# Patient Record
Sex: Female | Born: 2000 | Race: White | Hispanic: No | Marital: Single | Smoking: Former smoker
Health system: Southern US, Community
[De-identification: ages and names within clinical notes are randomized; demographics above are authoritative.]

## PROBLEM LIST (undated history)

## (undated) DIAGNOSIS — I10 Essential (primary) hypertension: Secondary | ICD-10-CM

## (undated) DIAGNOSIS — R109 Unspecified abdominal pain: Secondary | ICD-10-CM

## (undated) DIAGNOSIS — N200 Calculus of kidney: Secondary | ICD-10-CM

## (undated) DIAGNOSIS — Z9109 Other allergy status, other than to drugs and biological substances: Secondary | ICD-10-CM

## (undated) DIAGNOSIS — G8929 Other chronic pain: Secondary | ICD-10-CM

## (undated) DIAGNOSIS — F909 Attention-deficit hyperactivity disorder, unspecified type: Secondary | ICD-10-CM

## (undated) DIAGNOSIS — K5792 Diverticulitis of intestine, part unspecified, without perforation or abscess without bleeding: Secondary | ICD-10-CM

## (undated) DIAGNOSIS — F411 Generalized anxiety disorder: Secondary | ICD-10-CM

## (undated) DIAGNOSIS — M542 Cervicalgia: Secondary | ICD-10-CM

## (undated) DIAGNOSIS — E559 Vitamin D deficiency, unspecified: Secondary | ICD-10-CM

## (undated) HISTORY — DX: Essential (primary) hypertension: I10

## (undated) HISTORY — DX: Cervicalgia: M54.2

## (undated) HISTORY — DX: Vitamin D deficiency, unspecified: E55.9

## (undated) HISTORY — DX: Other allergy status, other than to drugs and biological substances: Z91.09

## (undated) HISTORY — PX: APPENDECTOMY: SHX54

## (undated) HISTORY — DX: Attention-deficit hyperactivity disorder, unspecified type: F90.9

## (undated) HISTORY — DX: Other chronic pain: G89.29

---
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
## 2018-01-12 ENCOUNTER — Ambulatory Visit (INDEPENDENT_AMBULATORY_CARE_PROVIDER_SITE_OTHER): Payer: Medicaid Other | Admitting: Neurology

## 2018-01-12 ENCOUNTER — Encounter: Payer: Self-pay | Admitting: Neurology

## 2018-01-12 VITALS — BP 112/71 | HR 63 | Ht 60.0 in | Wt 112.0 lb

## 2018-01-12 DIAGNOSIS — M542 Cervicalgia: Secondary | ICD-10-CM | POA: Diagnosis not present

## 2018-01-12 DIAGNOSIS — M7918 Myalgia, other site: Secondary | ICD-10-CM | POA: Diagnosis not present

## 2018-01-12 DIAGNOSIS — M5481 Occipital neuralgia: Secondary | ICD-10-CM

## 2018-01-12 DIAGNOSIS — S39012A Strain of muscle, fascia and tendon of lower back, initial encounter: Secondary | ICD-10-CM | POA: Insufficient documentation

## 2018-01-12 MED ORDER — TIZANIDINE HCL 4 MG PO TABS: ORAL_TABLET | ORAL | 3 refills | Status: DC

## 2018-01-12 NOTE — Patient Instructions (Signed)
- Physical therapy for neck and low back pain: Chronic Cervical and Acute Lumbar myofascial pain Please evaluate and treat including stretching, strengthening, manual therapy/massage, heating, TENS unit,  strengthening as clinically warranted as well as any other modality as recommended by evaluation. - Orthopaedics for evaluation of Occipital nerve intervention such as radiofrequency ablation, c3/c3 medial branch block or any other procedure as clinically warranted by examination of Dr. Alvester Morin. Can also evaluate for ESI in the lumbar spine if needed. Chronic neck and low back pain, likely musculoskeletal and myofascial - Tizanidine at bedtime for her cervical myofascial pain and acute lumbar myofascial strain - Pain on palpation of cervical muscles and lower lumbar paraspinals. Likely muscle spasms. PT. - Discussed medication overuse, and sequelae of using BC powders daily including organ damage and rebound headaches.  Tizanidine tablets or capsules What is this medicine? TIZANIDINE (tye ZAN i deen) helps to relieve muscle spasms. It may be used to help in the treatment of multiple sclerosis and spinal cord injury. This medicine may be used for other purposes; ask your health care provider or pharmacist if you have questions. COMMON BRAND NAME(S): Zanaflex What should I tell my health care provider before I take this medicine? They need to know if you have any of these conditions: -kidney disease -liver disease -low blood pressure -mental disorder -an unusual or allergic reaction to tizanidine, other medicines, lactose (tablets only), foods, dyes, or preservatives -pregnant or trying to get pregnant -breast-feeding How should I use this medicine? Take this medicine by mouth with a full glass of water. Take this medicine on an empty stomach, at least 30 minutes before or 2 hours after food. Do not take with food unless you talk with your doctor. Follow the directions on the prescription label.  Take your medicine at regular intervals. Do not take your medicine more often than directed. Do not stop taking except on your doctor's advice. Suddenly stopping the medicine can be very dangerous. Talk to your pediatrician regarding the use of this medicine in children. Patients over 93 years old may have a stronger reaction and need a smaller dose. Overdosage: If you think you have taken too much of this medicine contact a poison control center or emergency room at once. NOTE: This medicine is only for you. Do not share this medicine with others. What if I miss a dose? If you miss a dose, take it as soon as you can. If it is almost time for your next dose, take only that dose. Do not take double or extra doses. What may interact with this medicine? Do not take this medicine with any of the following medications: -ciprofloxacin -cisapride -dofetilide -dronedarone -fluvoxamine -narcotic medicines for cough -pimozide -thiabendazole -thioridazine -ziprasidone This medicine may also interact with the following medications: -acyclovir -alcohol -antihistamines for allergy, cough and cold -baclofen -certain antibiotics like levofloxacin, ofloxacin -certain medicines for anxiety or sleep -certain medicines for blood pressure, heart disease, irregular heart beat -certain medicines for depression like amitriptyline, fluoxetine, sertraline -certain medicines for seizures like phenobarbital, primidone -certain medicines for stomach problems like cimetidine, famotidine -female hormones, like estrogens or progestins and birth control pills, patches, rings, or injections -general anesthetics like halothane, isoflurane, methoxyflurane, propofol -local anesthetics like lidocaine, pramoxine, tetracaine -medicines that relax muscles for surgery -narcotic medicines for pain -other medicines that prolong the QT interval (cause an abnormal heart rhythm) -phenothiazines like chlorpromazine,  mesoridazine, prochlorperazine -ticlopidine -zileuton This list may not describe all possible interactions. Give your health care provider  a list of all the medicines, herbs, non-prescription drugs, or dietary supplements you use. Also tell them if you smoke, drink alcohol, or use illegal drugs. Some items may interact with your medicine. What should I watch for while using this medicine? Tell your doctor or health care professional if your symptoms do not start to get better or if they get worse. You may get drowsy or dizzy. Do not drive, use machinery, or do anything that needs mental alertness until you know how this medicine affects you. Do not stand or sit up quickly, especially if you are an older patient. This reduces the risk of dizzy or fainting spells. Alcohol may interfere with the effect of this medicine. Avoid alcoholic drinks. If you are taking another medicine that also causes drowsiness, you may have more side effects. Give your health care provider a list of all medicines you use. Your doctor will tell you how much medicine to take. Do not take more medicine than directed. Call emergency for help if you have problems breathing or unusual sleepiness. Your mouth may get dry. Chewing sugarless gum or sucking hard candy, and drinking plenty of water may help. Contact your doctor if the problem does not go away or is severe. What side effects may I notice from receiving this medicine? Side effects that you should report to your doctor or health care professional as soon as possible: -allergic reactions like skin rash, itching or hives, swelling of the face, lips, or tongue -breathing problems -hallucinations -signs and symptoms of liver injury like dark yellow or brown urine; general ill feeling or flu-like symptoms; light-colored stools; loss of appetite; nausea; right upper quadrant belly pain; unusually weak or tired; yellowing of the eyes or skin -signs and symptoms of low blood  pressure like dizziness; feeling faint or lightheaded, falls; unusually weak or tired -unusually slow heartbeat -unusually weak or tired Side effects that usually do not require medical attention (report to your doctor or health care professional if they continue or are bothersome): -blurred vision -constipation -dizziness -dry mouth -tiredness This list may not describe all possible side effects. Call your doctor for medical advice about side effects. You may report side effects to FDA at 1-800-FDA-1088. Where should I keep my medicine? Keep out of the reach of children. Store at room temperature between 15 and 30 degrees C (59 and 86 degrees F). Throw away any unused medicine after the expiration date. NOTE: This sheet is a summary. It may not cover all possible information. If you have questions about this medicine, talk to your doctor, pharmacist, or health care provider.  2018 [employer]vier/Gold Standard (2014-12-12 13:52:12)

## 2018-01-12 NOTE — Progress Notes (Signed)
[facility]    Provider:  Dr Lucia Gaskins Referring Provider:  Norval Gable, DO Primary Care Physician:  Norval Gable, DO  CC:  Headaches  HPI:  [name] Escobar is a 17 y.o. female here as requested by Dr.  Norval Gable, DO for chronic non-intractable headache. PMHx Migraines, chronic neck pain, panic attacks. She says her migraines started worsening 4 months ago in the setting of chronic neck and back pain for years for patient. She has had migraines for several years. The neck pain exacerbates migraines. She has tightness and tense neck worse when she tries to sleep. She is extremely afraid of needles. The head pain is in the back radiating up the back, more pressure, throbbing, intense and tight, +nausea, no light sensitivity, no smell sensitivity, no noise sensitivity. Also pressure in the temples. She has headaches daily. Worse when she sleeps and lays down, morning headaches.   Reviewed notes, labs and imaging from outside physicians, which showed:  Reviewed extensive notes from referring physician  Norval Gable, DO.  Patient has trouble with anxiety, has decreased energy level and is sleeping poorly, partly due to migraine headaches also other mood disorders.  No supplemental vitamins and iron and poor nutrition.  Patient does not exercise.  She sleeps 9 hours per night.  Her libido is decreased.  She has anxiety, excessive worry, fatigue, muscle tension, nervousness and panic attacks.  Onset was gradual years ago.  Anxiety has been occurring in an intermittent pattern.  Most of her medical problems are due to anxiety she uses benzodiazepines as needed requires 1 pill a day minimum.  She also has chronic neck pain.  She has fatigue and thyroid has been checked, vitamin D deficiency which is being followed, she currently uses tobacco and has been counseled multiple times about smoking and sequelae of smoking.  Also chronic pain.  Urine pregnancy test  -11/14/2017 was negative, CBC unremarkable also August 2019, CMP was unremarkable with BUN 10 and creatinine 1 this was taken on October 21, 2017, A1c 5.2, HIV nonreactive, TSH 1.1 normal, vitamin D 20 abnormal.      Review of Systems: Patient complains of symptoms per HPI as well as the following symptoms: headache, numbness, weakness, aching muscles, eye pain. Pertinent negatives and positives per HPI. All others negative.   Social History   Socioeconomic History  . Marital status: Single    Spouse name: Not on file  . Number of children: Not on file  . Years of education: 44  . Highest education level: 11th grade  Occupational History  . Not on file  Social Needs  . Financial resource strain: Not on file  . Food insecurity:    Worry: Not on file    Inability: Not on file  . Transportation needs:    Medical: Not on file    Non-medical: Not on file  Tobacco Use  . Smoking status: Former Smoker    Packs/day: 1.50    Years: 4.00    Pack years: 6.00    Types: Cigarettes    Last attempt to quit: 10/12/2017    Years since quitting: 0.2  . Smokeless tobacco: Never Used  Substance and Sexual Activity  . Alcohol use: Yes    Comment: rarely, only drank 3 times   . Drug use: Yes    Comment: CBD 01/12/18; Marijuana, denies IDU or other substances  . Sexual activity: Yes    Partners: Male    Birth control/protection: Injection  Lifestyle  .  Physical activity:    Days per week: Not on file    Minutes per session: Not on file  . Stress: Not on file  Relationships  . Social connections:    Talks on phone: Not on file    Gets together: Not on file    Attends religious service: Not on file    Active member of club or organization: Not on file    Attends meetings of clubs or organizations: Not on file    Relationship status: Not on file  . Intimate partner violence:    Fear of current or ex partner: Not on file    Emotionally abused: Not on file    Physically abused: Not on  file    Forced sexual activity: Not on file  Other Topics Concern  . Not on file  Social History Narrative   Parents are separated so the patient stays separately with mother and father.  She has a 81 year old sister not in the home.  1 dog at home.   Right handed   Caffeine: never     Family History  Problem Relation Age of Onset  . Hypertension Mother   . Migraines Mother   . Depression Sister   . Colon cancer Paternal Grandfather     Past Medical History:  Diagnosis Date  . Abdominal pain    hospitalized for abd pain at age 39yrs, no diagnosis at this time  . ADHD   . Anxiety state 05/19/2014  . Chronic neck pain   . Diverticulitis   . Hypertension, benign   . Kidney stone   . Kidney stone    right side CT 05/16/15 showed 3mm right side kidney stone  . Pollen allergies   . Vitamin D deficiency     Past Surgical History:  Procedure Laterality Date  . CECAL DIVERTICULECTOMY  05/17/2014  . LAPAROSCOPIC APPENDECTOMY  05/17/2014  . LAPAROSCOPIC APPENDECTOMY N/A 05/17/2014   Procedure: APPENDECTOMY LAPAROSCOPIC;  Surgeon: Judie Petit. Leonia Corona, MD;  Location: [facility] OR;  Service: Pediatrics;  Laterality: N/A;  . LAPAROSCOPIC ILEOCECECTOMY N/A 05/17/2014   Procedure: LAPAROSCOPIC DIVERTICULECTOMY ;  Surgeon: Judie Petit. Leonia Corona, MD;  Location: [facility] OR;  Service: Pediatrics;  Laterality: N/A;    Current Outpatient Medications  Medication Sig Dispense Refill  . DIFFERIN 0.1 % gel Apply 1 application topically at bedtime.   1  . medroxyPROGESTERone (DEPO-PROVERA) 150 MG/ML injection Inject 150 mg into the muscle every 3 (three) months.    . metoprolol tartrate (LOPRESSOR) 50 MG tablet Take 50 mg by mouth daily.    Marland Kitchen tiZANidine (ZANAFLEX) 4 MG tablet Take 1-2 tabs at bedtime for muscle spasms. 60 tablet 3   No current facility-administered medications for this visit.     Allergies as of 01/12/2018 - Review Complete 01/12/2018  Allergen Reaction Noted  . Amoxicillin Rash 03/05/2012     Vitals: BP 112/71 (BP Location: Right Arm, Patient Position: Sitting)   Pulse 63   Ht 5' (1.524 m)   Wt 112 lb (50.8 kg)   LMP 04/17/2017 (Within Months)   BMI 21.87 kg/m  Last Weight:  Wt Readings from Last 1 Encounters:  01/12/18 112 lb (50.8 kg) (21 %, Z= -0.82)*   * Growth percentiles are based on CDC (Girls, 2-20 Years) data.   Last Height:   Ht Readings from Last 1 Encounters:  01/12/18 5' (1.524 m) (5 %, Z= -1.67)*   * Growth percentiles are based on CDC (Girls, 2-20 Years) data.  Physical exam: Exam: Gen: NAD, conversant, well nourised, well groomed                     CV: RRR, no MRG. No Carotid Bruits. No peripheral edema, warm, nontender Eyes: Conjunctivae clear without exudates or hemorrhage MSK: Pain on palpation of cervical muscles and lower lumbar paraspinals.  Neuro: Detailed Neurologic Exam  Speech:    Speech is normal; fluent and spontaneous with normal comprehension.  Cognition:    The patient is oriented to person, place, and time;     recent and remote memory intact;     language fluent;     normal attention, concentration,     fund of knowledge Cranial Nerves:    The pupils are equal, round, and reactive to light. The fundi are normal and spontaneous venous pulsations are present. Visual fields are full to finger confrontation. Extraocular movements are intact. Trigeminal sensation is intact and the muscles of mastication are normal. The face is symmetric. The palate elevates in the midline. Hearing intact. Voice is normal. Shoulder shrug is normal. The tongue has normal motion without fasciculations.   Coordination:    Normal finger to nose and heel to shin. Normal rapid alternating movements.   Gait:    Heel-toe and tandem gait are normal.   Motor Observation:    No asymmetry, no atrophy, and no involuntary movements noted. Tone:    Normal muscle tone.    Posture:    Posture is normal. normal erect    Strength:    Strength is  V/V in the upper and lower limbs.      Sensation: intact to LT     Reflex Exam:  DTR's:    Deep tendon reflexes in the upper and lower extremities are normal bilaterally.   Toes:    The toes are downgoing bilaterally.   Clonus:    Clonus is absent.      Assessment/Plan:  17 year old patient with occipital headaches that sound more like cervicalgia than migraines. Taking BCs daily.   - Physical therapy for neck and low back pain: Chronic Cervical and Acute Lumbar myofascial pain Please evaluate and treat including stretching, strengthening, manual therapy/massage, heating, TENS unit,  strengthening as clinically warranted as well as any other modality as recommended by evaluation. - Orthopaedics for evaluation of Occipital nerve intervention such as radiofrequency ablation, c3/c3 medial branch block or any other procedure as clinically warranted by examination of Dr. Alvester Morin. Can also evaluate for ESI in the lumbar spine if needed. Chronic neck and low back pain, likely musculoskeletal and myofascial - Tizanidine at bedtime for her cervical myofascial pain and acute lumbar myofascial strain - Pain on palpation of cervical muscles and lower lumbar paraspinals. Likely muscle spasms. PT. - Discussed medication overuse, and sequelae of using BC powders daily including organ damage and rebound headaches.  Orders Placed This Encounter  Procedures  . Ambulatory referral to Physical Therapy  . Ambulatory referral to Orthopedic Surgery   Meds ordered this encounter  Medications  . tiZANidine (ZANAFLEX) 4 MG tablet    Sig: Take 1-2 tabs at bedtime for muscle spasms.    Dispense:  60 tablet    Refill:  3    Discussed: To prevent or relieve headaches, try the following: Cool Compress. Lie down and place a cool compress on your head.  Avoid headache triggers. If certain foods or odors seem to have triggered your migraines in the past, avoid them. A headache diary  might help you identify  triggers.  Include physical activity in your daily routine. Try a daily walk or other moderate aerobic exercise.  Manage stress. Find healthy ways to cope with the stressors, such as delegating tasks on your to-do list.  Practice relaxation techniques. Try deep breathing, yoga, massage and visualization.  Eat regularly. Eating regularly scheduled meals and maintaining a healthy diet might help prevent headaches. Also, drink plenty of fluids.  Follow a regular sleep schedule. Sleep deprivation might contribute to headaches Consider biofeedback. With this mind-body technique, you learn to control certain bodily functions - such as muscle tension, heart rate and blood pressure - to prevent headaches or reduce headache pain.    Proceed to emergency room if you experience new or worsening symptoms or symptoms do not resolve, if you have new neurologic symptoms or if headache is severe, or for any concerning symptom.   Provided education and documentation from American headache Society toolbox including articles on: chronic migraine medication overuse headache, chronic migraines, prevention of migraines, behavioral and other nonpharmacologic treatments for headache.   A total of 60 minutes was spent face-to-face with this patient. Over half this time was spent on counseling patient on the  1. Cervical myofascial pain syndrome   2. Cervicalgia   3. Acute myofascial strain of lumbar region, initial encounter   4. Bilateral occipital neuralgia    diagnosis and different diagnostic and therapeutic options, counseling and coordination of care, risks ans benefits of management, compliance, or risk factor reduction and education.    Cc: Norval Gable, DO   [name] Dean, MD  [facility] [address] [place], [place] [address]  Phone [phone] Fax [phone]

## 2018-02-02 ENCOUNTER — Ambulatory Visit (INDEPENDENT_AMBULATORY_CARE_PROVIDER_SITE_OTHER): Payer: Self-pay | Admitting: Physical Medicine and Rehabilitation

## 2018-03-02 ENCOUNTER — Encounter (INDEPENDENT_AMBULATORY_CARE_PROVIDER_SITE_OTHER): Payer: Self-pay | Admitting: Physical Medicine and Rehabilitation

## 2018-03-02 ENCOUNTER — Ambulatory Visit (INDEPENDENT_AMBULATORY_CARE_PROVIDER_SITE_OTHER): Payer: Medicaid Other | Admitting: Physical Medicine and Rehabilitation

## 2018-03-02 VITALS — BP 108/70 | HR 66 | Ht 60.0 in | Wt 109.0 lb

## 2018-03-02 DIAGNOSIS — R202 Paresthesia of skin: Secondary | ICD-10-CM

## 2018-03-02 DIAGNOSIS — M5412 Radiculopathy, cervical region: Secondary | ICD-10-CM | POA: Diagnosis not present

## 2018-03-02 DIAGNOSIS — M542 Cervicalgia: Secondary | ICD-10-CM

## 2018-03-02 DIAGNOSIS — M7918 Myalgia, other site: Secondary | ICD-10-CM | POA: Diagnosis not present

## 2018-03-02 DIAGNOSIS — M5416 Radiculopathy, lumbar region: Secondary | ICD-10-CM

## 2018-03-02 NOTE — Progress Notes (Signed)
 .  Numeric Pain Rating Scale and Functional Assessment Average Pain 7 Pain Right Now 6 My pain is intermittent, constant and aching Pain is worse with: laying down Pain improves with: nothing    In the last MONTH (on 0-10 scale) has pain interfered with the following?  1. General activity like being  able to carry out your everyday physical activities such as walking, climbing stairs, carrying groceries, or moving a chair?  Rating(7)  2. Relation with others like being able to carry out your usual social activities and roles such as  activities at home, at work and in your community. Rating(7)  3. Enjoyment of life such that you have  been bothered by emotional problems such as feeling anxious, depressed or irritable?  Rating(5)
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
## 2018-03-09 ENCOUNTER — Other Ambulatory Visit: Payer: Medicaid Other

## 2018-04-28 ENCOUNTER — Emergency Department (HOSPITAL_COMMUNITY)
Admission: EM | Admit: 2018-04-28 | Discharge: 2018-04-28 | Disposition: A | Discharge status: Home / Self Care | Payer: Medicaid Other | Attending: Emergency Medicine | Admitting: Emergency Medicine

## 2018-04-28 ENCOUNTER — Encounter (HOSPITAL_COMMUNITY): Payer: Self-pay | Admitting: Emergency Medicine

## 2018-04-28 DIAGNOSIS — Z5321 Procedure and treatment not carried out due to patient leaving prior to being seen by health care provider: Secondary | ICD-10-CM | POA: Insufficient documentation

## 2018-04-28 DIAGNOSIS — R42 Dizziness and giddiness: Secondary | ICD-10-CM | POA: Insufficient documentation

## 2018-04-28 NOTE — ED Triage Notes (Signed)
Patient here via EMS from home reporting that she has been doing drugs for the past 3 days including xanax, molly, ketamine, shrooms. Also states that she has "huffing". Now reports a "fuzzy feeling" in head. Requesting a MRI.

## 2019-04-29 ENCOUNTER — Other Ambulatory Visit: Payer: Self-pay | Admitting: Physical Medicine and Rehabilitation

## 2019-04-29 DIAGNOSIS — M5416 Radiculopathy, lumbar region: Secondary | ICD-10-CM

## 2019-06-11 ENCOUNTER — Emergency Department (HOSPITAL_BASED_OUTPATIENT_CLINIC_OR_DEPARTMENT_OTHER)
Admission: EM | Admit: 2019-06-11 | Discharge: 2019-06-11 | Disposition: A | Discharge status: Home / Self Care | Payer: Medicaid Other | Attending: Emergency Medicine | Admitting: Emergency Medicine

## 2019-06-11 ENCOUNTER — Encounter (HOSPITAL_BASED_OUTPATIENT_CLINIC_OR_DEPARTMENT_OTHER): Payer: Self-pay | Admitting: Emergency Medicine

## 2019-06-11 ENCOUNTER — Other Ambulatory Visit: Payer: Self-pay

## 2019-06-11 ENCOUNTER — Emergency Department (HOSPITAL_BASED_OUTPATIENT_CLINIC_OR_DEPARTMENT_OTHER): Payer: Medicaid Other

## 2019-06-11 DIAGNOSIS — I10 Essential (primary) hypertension: Secondary | ICD-10-CM | POA: Insufficient documentation

## 2019-06-11 DIAGNOSIS — Z87891 Personal history of nicotine dependence: Secondary | ICD-10-CM | POA: Insufficient documentation

## 2019-06-11 DIAGNOSIS — R072 Precordial pain: Secondary | ICD-10-CM | POA: Insufficient documentation

## 2019-06-11 DIAGNOSIS — F419 Anxiety disorder, unspecified: Secondary | ICD-10-CM | POA: Diagnosis not present

## 2019-06-11 DIAGNOSIS — Z79899 Other long term (current) drug therapy: Secondary | ICD-10-CM | POA: Diagnosis not present

## 2019-06-11 NOTE — Discharge Instructions (Addendum)
Please make sure you are drinking plenty of fluid and staying well hydrated.

## 2019-06-11 NOTE — ED Triage Notes (Signed)
Reports panic attack earlier today, she states she is still anxious but not panicked. Also reports 3 year hx of sharp pain intermittently under L breast. Has had w/u for same. Also reports L hand pain since earlier today

## 2019-06-11 NOTE — ED Provider Notes (Signed)
[facility] EMERGENCY DEPARTMENT Provider Note   CSN: [id number] Arrival date & time: 06/11/19  2045     History Chief Complaint  Patient presents with  . Anxiety    [name] Escobar is a 19 y.o. female with a past medical history of ADHD, anxiety, chronic cervical myofascial pain syndrome, diverticulitis, renal stones, who presents today for evaluation of left-sided chest pain. She reports that last night she used cocaine and GHB.  She states that they were able to test them before using them and she knows that they were clean.  She states that she is used these before without similar symptoms the next morning. She states that today she was watching TV when she started feeling anxious, developed tightness in the left side of her chest under her breast.  She states that she has had pains like this in the past.  They tend to radiate into her left arm like they did today.  The radiation into her left arm is resolving, however she reports that she still has pain under her left breast.  She denies any shortness of breath or fevers.  No nausea vomiting diarrhea.  No diaphoresis.  She does not have any known cardiac history.  She reports that she has not had any depo in over 4 months.   HPI     Past Medical History:  Diagnosis Date  . Abdominal pain    hospitalized for abd pain at age 43yrs, no diagnosis at this time  . ADHD   . Anxiety state 05/19/2014  . Chronic neck pain   . Diverticulitis   . Hypertension, benign   . Kidney stone   . Kidney stone    right side CT 05/16/15 showed 67mm right side kidney stone  . Pollen allergies   . Vitamin D deficiency     Patient Active Problem List   Diagnosis Date Noted  . Cervical myofascial pain syndrome 01/12/2018  . Cervicalgia 01/12/2018  . Acute lumbar myofascial strain 01/12/2018  . Anxiety state 05/19/2014  . Diverticulitis of cecum s/p lap diverticulectomy 05/17/2014 05/15/2014    Past Surgical History:  Procedure Laterality  Date  . APPENDECTOMY    . CECAL DIVERTICULECTOMY  05/17/2014  . LAPAROSCOPIC APPENDECTOMY  05/17/2014  . LAPAROSCOPIC APPENDECTOMY N/A 05/17/2014   Procedure: APPENDECTOMY LAPAROSCOPIC;  Surgeon: Judie Petit. Leonia Corona, MD;  Location: [facility] OR;  Service: Pediatrics;  Laterality: N/A;  . LAPAROSCOPIC ILEOCECECTOMY N/A 05/17/2014   Procedure: LAPAROSCOPIC DIVERTICULECTOMY ;  Surgeon: Judie Petit. Leonia Corona, MD;  Location: [facility] OR;  Service: Pediatrics;  Laterality: N/A;     OB History   No obstetric history on file.     Family History  Problem Relation Age of Onset  . Hypertension Mother   . Migraines Mother   . Depression Sister   . Colon cancer Paternal Grandfather     Social History   Tobacco Use  . Smoking status: Former Smoker    Packs/day: 1.50    Years: 4.00    Pack years: 6.00    Types: Cigarettes    Quit date: 10/12/2017    Years since quitting: 1.6  . Smokeless tobacco: Never Used  Substance Use Topics  . Alcohol use: Yes    Comment: rarely, only drank 3 times   . Drug use: Yes    Types: Cocaine    Comment: "Molly and GHB" approximately twice a month    Home Medications Prior to Admission medications   Medication Sig Start Date End  Date Taking? Authorizing Provider  DIFFERIN 0.1 % gel Apply 1 application topically at bedtime.  04/27/14   [provider]  medroxyPROGESTERone (DEPO-PROVERA) 150 MG/ML injection Inject 150 mg into the muscle every 3 (three) months.    [provider]  metoprolol tartrate (LOPRESSOR) 50 MG tablet Take 50 mg by mouth daily.    [provider]  tiZANidine (ZANAFLEX) 4 MG tablet Take 1-2 tabs at bedtime for muscle spasms. 01/12/18   Melvenia Beam, MD    Allergies    Amoxicillin  Review of Systems   Review of Systems  Constitutional: Negative for chills and fever.  Respiratory: Negative for cough and shortness of breath.   Cardiovascular: Positive for chest pain. Negative for palpitations and leg swelling.    Psychiatric/Behavioral: The patient is nervous/anxious.   All other systems reviewed and are negative.   Physical Exam Updated Vital Signs BP 119/78 (BP Location: Right Arm)   Pulse (!) 57   Temp 98.5 F (36.9 C) (Oral)   Resp 15   Ht 5\' 4"  (1.626 m)   Wt 46.7 kg   LMP 12/12/2018 Comment: depo provera  SpO2 100%   BMI 17.68 kg/m   Physical Exam Vitals and nursing note reviewed.  Constitutional:      General: She is not in acute distress.    Appearance: She is well-developed. She is not diaphoretic.  HENT:     Head: Normocephalic and atraumatic.  Eyes:     General: No scleral icterus.       Right eye: No discharge.        Left eye: No discharge.     Conjunctiva/sclera: Conjunctivae normal.  Cardiovascular:     Rate and Rhythm: Normal rate and regular rhythm.     Pulses: Normal pulses.     Heart sounds: Normal heart sounds. No murmur.  Pulmonary:     Effort: Pulmonary effort is normal. No respiratory distress.     Breath sounds: Normal breath sounds. No stridor.  Chest:     Comments: Palpation of left anterior chest recreates and exacerbates her pain.  Abdominal:     General: There is no distension.  Musculoskeletal:        General: No deformity.     Cervical back: Normal range of motion and neck supple.     Right lower leg: No edema.     Left lower leg: No edema.  Skin:    General: Skin is warm and dry.  Neurological:     General: No focal deficit present.     Mental Status: She is alert.     Cranial Nerves: No cranial nerve deficit.     Motor: No abnormal muscle tone.  Psychiatric:        Mood and Affect: Mood normal.        Behavior: Behavior normal.     ED Results / Procedures / Treatments   Labs (all labs ordered are listed, but only abnormal results are displayed) Labs Reviewed - No data to display  EKG EKG Interpretation  Date/Time:  Saturday June 11 2019 21:51:09 EDT Ventricular Rate:  64 PR Interval:    QRS Duration: 76 QT  Interval:  403 QTC Calculation: 416 R Axis:   68 Text Interpretation: Sinus rhythm RSR' in V1 or V2, probably normal variant since last tracing no significant change Confirmed by Malvin Johns [phone]) on 06/11/2019 10:39:12 PM   Radiology DG Chest 2 View  Result Date: 06/11/2019 CLINICAL DATA:  Left anterior  chest pain. Anxiety. EXAM: CHEST - 2 VIEW COMPARISON:  None. FINDINGS: The cardiomediastinal contours are normal. The lungs are clear. Pulmonary vasculature is normal. No consolidation, pleural effusion, or pneumothorax. No acute osseous abnormalities are seen. IMPRESSION: Negative radiographs of the chest. Electronically Signed   By: Narda Rutherford M.D.   On: 06/11/2019 22:10    Procedures Procedures (including critical care time)  Medications Ordered in ED Medications - No data to display  ED Course  I have reviewed the triage vital signs and the nursing notes.  Pertinent labs & imaging results that were available during my care of the patient were reviewed by me and considered in my medical decision making (see chart for details).    MDM Rules/Calculators/A&P                     Patient is a 19 year old woman who presents today for evaluation of what she suspects is a panic attack with feelings of anxiety and left-sided precordial chest pain.  She states that she frequently will get left-sided precordial chest pain with pain aching into her arm.  She states this has been evaluated previously.  Here today her pain is reproducible with palpation over her left anterior chest.  EKG obtained without evidence of ischemia.  She is PERC negative.  Do not suspect ACS.  We discussed role of lab work which patient declined.  Chest x-ray was obtained without consolidation, pneumothorax, or other abnormality.  We discussed the cessation of stimulants such as the cocaine and GHB that she recently ingested.  In addition we discussed anxiety coping mechanisms.   Recommended PCP  follow-up.  Return precautions were discussed with patient who states their understanding.  At the time of discharge patient denied any unaddressed complaints or concerns.  Patient is agreeable for discharge home.  Note: Portions of this report may have been transcribed using voice recognition software. Every effort was made to ensure accuracy; however, inadvertent computerized transcription errors may be present  Final Clinical Impression(s) / ED Diagnoses Final diagnoses:  Anxiety  Precordial pain    Rx / DC Orders ED Discharge Orders    None       Cristina Gong, PA-C 06/12/19 0049    Rolan Bucco, MD 06/12/19 [phone]

## 2019-06-11 NOTE — ED Notes (Signed)
Pt States last taken GHB and cocaine last night.

## 2019-06-20 ENCOUNTER — Emergency Department (HOSPITAL_BASED_OUTPATIENT_CLINIC_OR_DEPARTMENT_OTHER)
Admission: EM | Admit: 2019-06-20 | Discharge: 2019-06-20 | Disposition: A | Discharge status: Home / Self Care | Payer: Medicaid Other | Attending: Emergency Medicine | Admitting: Emergency Medicine

## 2019-06-20 ENCOUNTER — Encounter (HOSPITAL_BASED_OUTPATIENT_CLINIC_OR_DEPARTMENT_OTHER): Payer: Self-pay | Admitting: *Deleted

## 2019-06-20 ENCOUNTER — Other Ambulatory Visit: Payer: Self-pay

## 2019-06-20 DIAGNOSIS — Z79899 Other long term (current) drug therapy: Secondary | ICD-10-CM | POA: Insufficient documentation

## 2019-06-20 DIAGNOSIS — F410 Panic disorder [episodic paroxysmal anxiety] without agoraphobia: Secondary | ICD-10-CM | POA: Diagnosis present

## 2019-06-20 DIAGNOSIS — Z87891 Personal history of nicotine dependence: Secondary | ICD-10-CM | POA: Insufficient documentation

## 2019-06-20 DIAGNOSIS — R030 Elevated blood-pressure reading, without diagnosis of hypertension: Secondary | ICD-10-CM

## 2019-06-20 DIAGNOSIS — I10 Essential (primary) hypertension: Secondary | ICD-10-CM | POA: Insufficient documentation

## 2019-06-20 DIAGNOSIS — F419 Anxiety disorder, unspecified: Secondary | ICD-10-CM

## 2019-06-20 DIAGNOSIS — F1410 Cocaine abuse, uncomplicated: Secondary | ICD-10-CM | POA: Insufficient documentation

## 2019-06-20 MED ORDER — HYDROXYZINE HCL 25 MG PO TABS: 25.0000 mg | ORAL_TABLET | Freq: Four times a day (QID) | ORAL | 0 refills | Status: DC

## 2019-06-20 MED ORDER — METOPROLOL TARTRATE 50 MG PO TABS: 50.0000 mg | ORAL_TABLET | Freq: Every day | ORAL | 0 refills | Status: DC

## 2019-06-20 NOTE — ED Triage Notes (Signed)
States she ran out of BP medication 2 days ago. She also states she had a panic attack this am. For the past 2 hours she feels anxious causing her BP to increase.

## 2019-06-20 NOTE — ED Provider Notes (Signed)
[facility] EMERGENCY DEPARTMENT Provider Note   CSN: [id number] Arrival date & time: 06/20/19  2037     History Chief Complaint  Patient presents with  . Hypertension  . Anxiety    [name] Escobar is a 19 y.o. female with past medical history significant for ADHD, anxiety, hypertension who presents for evaluation of hypertension.  Patient states she has been out of her home blood pressure medicine, metoprolol over the last 2 days.  Does not have any refills.  States she felt anxious about this and has been rechecking her blood pressure at home.  States highest was 150/130.  Had a panic attack this morning which was her "normal panic attack."  She is supposed to be on Xanax however does not like taking this and has not taken this today.  She denies any headache, lightheadedness, dizziness, facial droop, unilateral weakness, paresthesias, neck pain, neck stiffness, chest pain, shortness breath abdominal pain, diarrhea, dysuria.  She does not feel anxious currently.  Denies any SI, HI or AVH.  Denies additional aggravating or alleviating factors.  History obtained from patient and past medical records.  No interpreter is used.  HPI     Past Medical History:  Diagnosis Date  . Abdominal pain    hospitalized for abd pain at age 41yrs, no diagnosis at this time  . ADHD   . Anxiety state 05/19/2014  . Chronic neck pain   . Diverticulitis   . Hypertension, benign   . Kidney stone   . Kidney stone    right side CT 05/16/15 showed 32mm right side kidney stone  . Pollen allergies   . Vitamin D deficiency     Patient Active Problem List   Diagnosis Date Noted  . Cervical myofascial pain syndrome 01/12/2018  . Cervicalgia 01/12/2018  . Acute lumbar myofascial strain 01/12/2018  . Anxiety state 05/19/2014  . Diverticulitis of cecum s/p lap diverticulectomy 05/17/2014 05/15/2014    Past Surgical History:  Procedure Laterality Date  . APPENDECTOMY    . CECAL DIVERTICULECTOMY   05/17/2014  . LAPAROSCOPIC APPENDECTOMY  05/17/2014  . LAPAROSCOPIC APPENDECTOMY N/A 05/17/2014   Procedure: APPENDECTOMY LAPAROSCOPIC;  Surgeon: Judie Petit. Leonia Corona, MD;  Location: [facility] OR;  Service: Pediatrics;  Laterality: N/A;  . LAPAROSCOPIC ILEOCECECTOMY N/A 05/17/2014   Procedure: LAPAROSCOPIC DIVERTICULECTOMY ;  Surgeon: Judie Petit. Leonia Corona, MD;  Location: [facility] OR;  Service: Pediatrics;  Laterality: N/A;     OB History   No obstetric history on file.     Family History  Problem Relation Age of Onset  . Hypertension Mother   . Migraines Mother   . Depression Sister   . Colon cancer Paternal Grandfather     Social History   Tobacco Use  . Smoking status: Former Smoker    Packs/day: 1.50    Years: 4.00    Pack years: 6.00    Types: Cigarettes    Quit date: 10/12/2017    Years since quitting: 1.6  . Smokeless tobacco: Never Used  Substance Use Topics  . Alcohol use: Yes    Comment: rarely, only drank 3 times   . Drug use: Yes    Types: Cocaine    Comment: "Molly and GHB" approximately twice a month    Home Medications Prior to Admission medications   Medication Sig Start Date End Date Taking? Authorizing Provider  DIFFERIN 0.1 % gel Apply 1 application topically at bedtime.  04/27/14   [provider]  hydrOXYzine (ATARAX/VISTARIL) 25 MG  tablet Take 1 tablet (25 mg total) by mouth every 6 (six) hours. 06/20/19   Ashyr Hedgepath A, PA-C  medroxyPROGESTERone (DEPO-PROVERA) 150 MG/ML injection Inject 150 mg into the muscle every 3 (three) months.    [provider]  metoprolol tartrate (LOPRESSOR) 50 MG tablet Take 1 tablet (50 mg total) by mouth daily. 06/20/19   Leray Garverick A, PA-C  tiZANidine (ZANAFLEX) 4 MG tablet Take 1-2 tabs at bedtime for muscle spasms. 01/12/18   Anson Fret, MD    Allergies    Amoxicillin  Review of Systems   Review of Systems  Constitutional: Negative.   HENT: Negative.   Respiratory: Negative.   Cardiovascular: Negative.    Gastrointestinal: Negative.   Genitourinary: Negative.   Musculoskeletal: Negative.   Skin: Negative.   Neurological: Negative.   Psychiatric/Behavioral: The patient is nervous/anxious.   All other systems reviewed and are negative.   Physical Exam Updated Vital Signs BP (!) 151/93   Pulse 60   Temp 99.2 F (37.3 C) (Oral)   Resp 18   Ht 5\' 4"  (1.626 m)   Wt 46.7 kg   SpO2 100%   BMI 17.67 kg/m   Physical Exam Vitals and nursing note reviewed.  Constitutional:      General: She is not in acute distress.    Appearance: She is well-developed. She is not ill-appearing, toxic-appearing or diaphoretic.  HENT:     Head: Normocephalic and atraumatic.     Nose: Nose normal.     Mouth/Throat:     Mouth: Mucous membranes are moist.     Pharynx: Oropharynx is clear.  Eyes:     Pupils: Pupils are equal, round, and reactive to light.     Comments: No horizontal, vertical or rotational nystagmus    Neck:     Trachea: Trachea and phonation normal.     Comments: Full active and passive ROM without pain No midline or paraspinal tenderness No nuchal rigidity or meningeal signs  Cardiovascular:     Rate and Rhythm: Normal rate.     Pulses: Normal pulses.     Heart sounds: Normal heart sounds.  Pulmonary:     Effort: Pulmonary effort is normal. No respiratory distress.     Breath sounds: Normal breath sounds.  Abdominal:     General: Bowel sounds are normal. There is no distension.  Musculoskeletal:        General: Normal range of motion.     Cervical back: Full passive range of motion without pain and normal range of motion.  Skin:    General: Skin is warm and dry.     Capillary Refill: Capillary refill takes less than 2 seconds.  Neurological:     General: No focal deficit present.     Mental Status: She is alert and oriented to person, place, and time.     Comments: Mental Status:  Alert, oriented, thought content appropriate. Speech fluent without evidence of aphasia.  Able to follow 2 step commands without difficulty.  Cranial Nerves:  II:  Peripheral visual fields grossly normal, pupils equal, round, reactive to light III,IV, VI: ptosis not present, extra-ocular motions intact bilaterally  V,VII: smile symmetric, facial light touch sensation equal VIII: hearing grossly normal bilaterally  IX,X: midline uvula rise  XI: bilateral shoulder shrug equal and strong XII: midline tongue extension  Motor:  5/5 in upper and lower extremities bilaterally including strong and equal grip strength and dorsiflexion/plantar flexion Sensory: Pinprick and light touch normal in all  extremities.  Deep Tendon Reflexes: 2+ and symmetric  Cerebellar: normal finger-to-nose with bilateral upper extremities Gait: normal gait and balance CV: distal pulses palpable throughout    Psychiatric:     Comments: Denies SI, HI , AVH.  Linear thought process.  No rapid or pressured speech.    ED Results / Procedures / Treatments   Labs (all labs ordered are listed, but only abnormal results are displayed) Labs Reviewed - No data to display  EKG None  Radiology No results found.  Procedures Procedures (including critical care time)  Medications Ordered in ED Medications - No data to display  ED Course  I have reviewed the triage vital signs and the nursing notes.  Pertinent labs & imaging results that were available during my care of the patient were reviewed by me and considered in my medical decision making (see chart for details).  19 year old resents for evaluation of elevated blood pressure.  Has history of hypertension is supposed be taking metoprolol.  She is been out of this x2 days.  Patient states she had a panic attack earlier today which is consistent with her prior panic attacks.  Patient states she took her blood pressure which was elevated at 150/130.  She denies any neurologic complaints, chest pain or shortness of breath.  Patient states she comes to "just  be checked out."  She follows with [facility] for mental health needs.  She denies SI, HI, AVH.  She does not appear actively psychotic and has linear and goal oriented thought process.  Her blood pressure is mildly elevated here at 151/93 where she has a nonfocal neuro exam without deficits.  Discussed labs which patient declined.  We will refill her blood pressure medicine.  She is to return for any worsening symptoms.  Have low suspicion for pretense of urgency or emergency as she is without headache, lightheadedness, dizziness, chest pain, shortness of breath, paresthesias.  She is also requesting a nonsedating medication for her anxiety besides Xanax that she does not like taking this prescription that she has at home.  We will give her some hydroxyzine.  Discussed close follow-up with [facility].  She is to return if she has any new worsening symptoms  The patient has been appropriately medically screened and/or stabilized in the ED. I have low suspicion for any other emergent medical condition which would require further screening, evaluation or treatment in the ED or require inpatient management.  Patient is hemodynamically stable and in no acute distress.  Patient able to ambulate in department prior to ED.  Evaluation does not show acute pathology that would require ongoing or additional emergent interventions while in the emergency department or further inpatient treatment.  I have discussed the diagnosis with the patient and answered all questions.  Pain is been managed while in the emergency department and patient has no further complaints prior to discharge.  Patient is comfortable with plan discussed in room and is stable for discharge at this time.  I have discussed strict return precautions for returning to the emergency department.  Patient was encouraged to follow-up with PCP/specialist refer to at discharge.    MDM Rules/Calculators/A&P                       Final Clinical Impression(s) / ED  Diagnoses Final diagnoses:  Elevated blood pressure reading  Anxiety    Rx / DC Orders ED Discharge Orders         Ordered  hydrOXYzine (ATARAX/VISTARIL) 25 MG tablet  Every 6 hours     06/20/19 2124    metoprolol tartrate (LOPRESSOR) 50 MG tablet  Daily     06/20/19 2124           Rotha Cassels A, PA-C 06/20/19 2125    Virgina Norfolk, DO 06/20/19 2152

## 2019-06-20 NOTE — Discharge Instructions (Signed)
Take your home blood pressure medicines.  Follow-up with psychiatry  Return for new or worsening symptoms

## 2022-01-12 ENCOUNTER — Emergency Department (HOSPITAL_BASED_OUTPATIENT_CLINIC_OR_DEPARTMENT_OTHER): Payer: Medicaid Other | Admitting: Radiology

## 2022-01-12 ENCOUNTER — Other Ambulatory Visit: Payer: Self-pay

## 2022-01-12 ENCOUNTER — Encounter (HOSPITAL_BASED_OUTPATIENT_CLINIC_OR_DEPARTMENT_OTHER): Payer: Self-pay | Admitting: *Deleted

## 2022-01-12 DIAGNOSIS — S83005A Unspecified dislocation of left patella, initial encounter: Secondary | ICD-10-CM | POA: Insufficient documentation

## 2022-01-12 DIAGNOSIS — Y9341 Activity, dancing: Secondary | ICD-10-CM | POA: Insufficient documentation

## 2022-01-12 DIAGNOSIS — X501XXA Overexertion from prolonged static or awkward postures, initial encounter: Secondary | ICD-10-CM | POA: Insufficient documentation

## 2022-01-12 DIAGNOSIS — S8992XA Unspecified injury of left lower leg, initial encounter: Secondary | ICD-10-CM | POA: Diagnosis present

## 2022-01-12 MED ORDER — OXYCODONE-ACETAMINOPHEN 5-325 MG PO TABS
1.0000 | ORAL_TABLET | ORAL | Status: DC | PRN
  Administered 2022-01-12: 1 via ORAL
  Filled 2022-01-12: qty 1

## 2022-01-12 NOTE — ED Triage Notes (Signed)
Patient states she dislocated her left knee last night.  She states her knee popped back into place at 0100.  Patient states she has ongoing pain and swelling.  She cannot bear any weight nor bend her knee.  Patient took ibuprofen last night.

## 2022-01-13 ENCOUNTER — Emergency Department (HOSPITAL_BASED_OUTPATIENT_CLINIC_OR_DEPARTMENT_OTHER)
Admission: EM | Admit: 2022-01-13 | Discharge: 2022-01-13 | Disposition: A | Discharge status: Home / Self Care | Payer: Medicaid Other | Attending: Emergency Medicine | Admitting: Emergency Medicine

## 2022-01-13 DIAGNOSIS — S83005A Unspecified dislocation of left patella, initial encounter: Secondary | ICD-10-CM

## 2022-01-13 MED ORDER — HYDROCODONE-ACETAMINOPHEN 5-325 MG PO TABS: 1.0000 | ORAL_TABLET | Freq: Four times a day (QID) | ORAL | 0 refills | Status: DC | PRN

## 2022-01-13 MED ORDER — HYDROCODONE-ACETAMINOPHEN 5-325 MG PO TABS
2.0000 | ORAL_TABLET | Freq: Once | ORAL | Status: AC
  Administered 2022-01-13: 2 via ORAL
  Filled 2022-01-13: qty 2

## 2022-01-13 NOTE — ED Notes (Signed)
Pt verbalizes understanding of discharge instructions. Opportunity for questioning and answers were provided. Pt discharged from ED to home with mother.   ? ?

## 2022-01-13 NOTE — ED Provider Notes (Signed)
[place] EMERGENCY DEPT Provider Note   CSN: [id number] Arrival date & time: 01/12/22  1922     History  Chief Complaint  Patient presents with   Knee Pain    Dislocated her knee last night    [name] Escobar is a 21 y.o. female.  Patient is a 21 year old female with past medical history of anxiety.  Patient presenting today with complaints of a left knee injury.  She reports dancing yesterday evening when she felt her kneecap dislocate.  The kneecap was out of place for approximately 30 minutes, then spontaneously reduced.  This has happened before with her other knee, but not this 1.  She has been having pain and difficulty with ambulation since.  The history is provided by the patient.       Home Medications Prior to Admission medications   Medication Sig Start Date End Date Taking? Authorizing Provider  DIFFERIN 0.1 % gel Apply 1 application topically at bedtime.  04/27/14   [provider]  hydrOXYzine (ATARAX/VISTARIL) 25 MG tablet Take 1 tablet (25 mg total) by mouth every 6 (six) hours. 06/20/19   Henderly, Britni A, PA-C  medroxyPROGESTERone (DEPO-PROVERA) 150 MG/ML injection Inject 150 mg into the muscle every 3 (three) months.    [provider]  metoprolol tartrate (LOPRESSOR) 50 MG tablet Take 1 tablet (50 mg total) by mouth daily. 06/20/19   Henderly, Britni A, PA-C  tiZANidine (ZANAFLEX) 4 MG tablet Take 1-2 tabs at bedtime for muscle spasms. 01/12/18   Melvenia Beam, MD      Allergies    Amoxicillin    Review of Systems   Review of Systems  All other systems reviewed and are negative.   Physical Exam Updated Vital Signs BP 114/69 (BP Location: Right Arm)   Pulse 67   Temp 98.4 F (36.9 C)   Resp 18   Ht 5' (1.524 m)   Wt 49.9 kg   SpO2 100%   BMI 21.48 kg/m  Physical Exam Vitals and nursing note reviewed.  Constitutional:      General: She is not in acute distress.    Appearance: Normal appearance. She is not  ill-appearing.  HENT:     Head: Normocephalic and atraumatic.  Pulmonary:     Effort: Pulmonary effort is normal.  Musculoskeletal:     Comments: The left knee appears grossly normal.  There is no deformity or effusion.  She has good range of motion with no crepitus.  Anterior and posterior drawer test are negative and there is no varus or valgus laxity.  Distal PMS is intact.  Skin:    General: Skin is warm and dry.  Neurological:     Mental Status: She is alert and oriented to person, place, and time.     ED Results / Procedures / Treatments   Labs (all labs ordered are listed, but only abnormal results are displayed) Labs Reviewed - No data to display  EKG None  Radiology DG Knee Complete 4 Views Left  Result Date: 01/12/2022 CLINICAL DATA:  Pain, swelling. EXAM: LEFT KNEE - COMPLETE 4+ VIEW COMPARISON:  None Available. FINDINGS: No evidence of fracture, dislocation, or joint effusion. No evidence of arthropathy or other focal bone abnormality. Soft tissues are unremarkable. IMPRESSION: Negative. Electronically Signed   By: Rolm Baptise M.D.   On: 01/12/2022 20:18    Procedures Procedures    Medications Ordered in ED Medications  oxyCODONE-acetaminophen (PERCOCET/ROXICET) 5-325 MG per tablet 1 tablet (1 tablet  Oral Given 01/12/22 1943)  HYDROcodone-acetaminophen (NORCO/VICODIN) 5-325 MG per tablet 2 tablet (has no administration in time range)    ED Course/ Medical Decision Making/ A&P  Patient presenting here with a left knee injury, likely a patellar dislocation as described in the HPI.  Patient's mother has a picture of her knee when the injury occurred and it does appear to have been a lateral dislocation.  Patient arrives here with stable vital signs.  Her knee examination is basically unremarkable and shows no evidence for ligamentous instability.  X-rays obtained showed no evidence for bony abnormality.  At this point, feel as though patient can safely be  discharged with medicine for pain, ice, crutches, compression, and follow-up as needed.  Final Clinical Impression(s) / ED Diagnoses Final diagnoses:  None    Rx / DC Orders ED Discharge Orders     None         Geoffery Lyons, MD 01/13/22 [phone]

## 2022-01-13 NOTE — Discharge Instructions (Signed)
Wear Ace bandage for comfort and support.  Ice for 20 minutes every 2 hours while awake for the next 2 days.  Elevate your leg is much as possible for the next 2 days.  Take ibuprofen 600 mg every 6 hours as needed for pain.  Begin taking hydrocodone as prescribed as needed for pain not relieved with ibuprofen.  Rest your knee for the next 3 days, then gradually reintroduce activity as tolerated.

## 2022-11-14 ENCOUNTER — Emergency Department (HOSPITAL_BASED_OUTPATIENT_CLINIC_OR_DEPARTMENT_OTHER)
Admission: EM | Admit: 2022-11-14 | Discharge: 2022-11-14 | Disposition: A | Discharge status: Home / Self Care | Payer: MEDICAID | Attending: Emergency Medicine | Admitting: Emergency Medicine

## 2022-11-14 ENCOUNTER — Other Ambulatory Visit: Payer: Self-pay

## 2022-11-14 ENCOUNTER — Encounter (HOSPITAL_BASED_OUTPATIENT_CLINIC_OR_DEPARTMENT_OTHER): Payer: Self-pay | Admitting: Emergency Medicine

## 2022-11-14 ENCOUNTER — Emergency Department (HOSPITAL_BASED_OUTPATIENT_CLINIC_OR_DEPARTMENT_OTHER): Payer: MEDICAID | Admitting: Radiology

## 2022-11-14 DIAGNOSIS — S99922A Unspecified injury of left foot, initial encounter: Secondary | ICD-10-CM

## 2022-11-14 DIAGNOSIS — W292XXA Contact with other powered household machinery, initial encounter: Secondary | ICD-10-CM | POA: Insufficient documentation

## 2022-11-14 MED ORDER — NAPROXEN 375 MG PO TABS: 375.0000 mg | ORAL_TABLET | Freq: Two times a day (BID) | ORAL | 0 refills | Status: DC

## 2022-11-14 NOTE — ED Provider Notes (Signed)
[place] EMERGENCY DEPARTMENT AT [facility] Provider Note   CSN: [id number] Arrival date & time: 11/14/22  1024     History  Chief Complaint  Patient presents with   Toe Injury    [name] Escobar is a 22 y.o. female who presents emergency department with chief complaint of toe injury.  Patient states that she got off work last night in the dark smacked her left fourth digit against her vacuum cleaner.  Patient states when she woke up this morning she had throbbing pain in the toe and was having a lot of difficulty bearing weight.  She had crutches at home which helped her take pressure off the toe.  She came in for further evaluation.  She denies any numbness or tingling.  HPI     Home Medications Prior to Admission medications   Medication Sig Start Date End Date Taking? Authorizing Provider  DIFFERIN 0.1 % gel Apply 1 application topically at bedtime.  04/27/14   [provider]  HYDROcodone-acetaminophen (NORCO) 5-325 MG tablet Take 1-2 tablets by mouth every 6 (six) hours as needed. 01/13/22   Geoffery Lyons, MD  hydrOXYzine (ATARAX/VISTARIL) 25 MG tablet Take 1 tablet (25 mg total) by mouth every 6 (six) hours. 06/20/19   Henderly, Britni A, PA-C  medroxyPROGESTERone (DEPO-PROVERA) 150 MG/ML injection Inject 150 mg into the muscle every 3 (three) months.    [provider]  metoprolol tartrate (LOPRESSOR) 50 MG tablet Take 1 tablet (50 mg total) by mouth daily. 06/20/19   Henderly, Britni A, PA-C  tiZANidine (ZANAFLEX) 4 MG tablet Take 1-2 tabs at bedtime for muscle spasms. 01/12/18   Anson Fret, MD      Allergies    Amoxicillin    Review of Systems   Review of Systems  Physical Exam Updated Vital Signs BP (!) 119/90 (BP Location: Right Arm)   Pulse 88   Temp 98 F (36.7 C) (Oral)   Resp 18   SpO2 99%  Physical Exam Vitals and nursing note reviewed.  Constitutional:      General: She is not in acute distress.    Appearance: She is  well-developed. She is not diaphoretic.  HENT:     Head: Normocephalic and atraumatic.     Right Ear: External ear normal.     Left Ear: External ear normal.     Nose: Nose normal.     Mouth/Throat:     Mouth: Mucous membranes are moist.  Eyes:     General: No scleral icterus.    Conjunctiva/sclera: Conjunctivae normal.  Cardiovascular:     Rate and Rhythm: Normal rate and regular rhythm.     Heart sounds: Normal heart sounds. No murmur heard.    No friction rub. No gallop.  Pulmonary:     Effort: Pulmonary effort is normal. No respiratory distress.     Breath sounds: Normal breath sounds.  Abdominal:     General: Bowel sounds are normal. There is no distension.     Palpations: Abdomen is soft. There is no mass.     Tenderness: There is no abdominal tenderness. There is no guarding.  Musculoskeletal:     Cervical back: Normal range of motion.  Feet:     Comments: Left 4th toe without obvious bruising, exquisite tenderness, difficulty bending the toe, no tenderness along the metatarsal shaft.  Cap refill less than 2 seconds, neurovascularly intact Skin:    General: Skin is warm and dry.  Neurological:  Mental Status: She is alert and oriented to person, place, and time.  Psychiatric:        Behavior: Behavior normal.     ED Results / Procedures / Treatments   Labs (all labs ordered are listed, but only abnormal results are displayed) Labs Reviewed - No data to display  EKG None  Radiology No results found.  Procedures Procedures    Medications Ordered in ED Medications - No data to display  ED Course/ Medical Decision Making/ A&P Clinical Course as of 11/14/22 1200  Fri Nov 14, 2022  1200 DG Toe 4th Left Roderic Scarce has not interpreted a left fourth toe x-ray, no acute findings [AH]    Clinical Course User Index [AH] Arthor Captain, PA-C                                 Medical Decision Making Patient here with left fourth toe injury.  I visualized and  interpreted a plain film which shows no acute fractures as per ED course.  There is no evidence of subungual hematoma.  She will be placed in postop shoe.  She has crutches.  Will discharge with anti-inflammatories, RICE protocol, outpatient follow-up and return precautions.  Amount and/or Complexity of Data Reviewed Radiology: ordered and independent interpretation performed.           Final Clinical Impression(s) / ED Diagnoses Final diagnoses:  Toe injury, left, initial encounter    Rx / DC Orders ED Discharge Orders     None         Arthor Captain, PA-C 11/14/22 1203    Melene Plan, DO 11/14/22 1336

## 2022-11-14 NOTE — ED Notes (Signed)
Discharge instructions, follow up care, pain management, and prescription reviewed and explained, pt verbalized understanding and had no further question on d/c. Pt caox4, NAD on d/c.

## 2022-11-14 NOTE — Discharge Instructions (Addendum)
Your x-ray was negative for any acute fractures or other injuries. I be discharging you with anti-inflammatory medications ice.  Rest and ice the foot today.  And may be significantly improved tomorrow. You continue to have significant discomfort with your foot please follow-up with your primary care physician.  This should improve over the next few days.  In the meantime use your crutches and wear your postop shoe.  Get help right away if: Your pain becomes severe. Your toe goes numb or changes color.

## 2022-11-14 NOTE — ED Triage Notes (Signed)
Pt c/o L 4th toe pain last night. Pt reports she stubbed her on vacuum. Denies fall or head injury

## 2023-01-28 ENCOUNTER — Ambulatory Visit (HOSPITAL_BASED_OUTPATIENT_CLINIC_OR_DEPARTMENT_OTHER): Payer: MEDICAID | Admitting: Certified Nurse Midwife

## 2023-01-28 ENCOUNTER — Other Ambulatory Visit (HOSPITAL_COMMUNITY)
Admission: RE | Admit: 2023-01-28 | Discharge: 2023-01-28 | Disposition: A | Discharge status: Home / Self Care | Payer: MEDICAID | Source: Ambulatory Visit | Attending: Certified Nurse Midwife | Admitting: Certified Nurse Midwife

## 2023-01-28 ENCOUNTER — Encounter (HOSPITAL_BASED_OUTPATIENT_CLINIC_OR_DEPARTMENT_OTHER): Payer: Self-pay | Admitting: Certified Nurse Midwife

## 2023-01-28 VITALS — BP 109/67 | HR 50 | Ht 60.0 in | Wt 122.6 lb

## 2023-01-28 DIAGNOSIS — Z01419 Encounter for gynecological examination (general) (routine) without abnormal findings: Secondary | ICD-10-CM | POA: Diagnosis not present

## 2023-01-28 DIAGNOSIS — Z113 Encounter for screening for infections with a predominantly sexual mode of transmission: Secondary | ICD-10-CM

## 2023-01-28 DIAGNOSIS — N898 Other specified noninflammatory disorders of vagina: Secondary | ICD-10-CM

## 2023-01-28 DIAGNOSIS — Z124 Encounter for screening for malignant neoplasm of cervix: Secondary | ICD-10-CM

## 2023-01-28 NOTE — Progress Notes (Signed)
GYNECOLOGY  VISIT  CC:   vaginal odor  HPI: 22 y.o. [id number] Single White or Caucasian female here for vaginal odor. Patient states she first noticed the odor a year ago. Described as "not fishy" but not her normal. She was sexually active until 5 months ago but not since then. She did not notice that the odor was stronger after intercourse.   Past Medical History:  Diagnosis Date   Abdominal pain    hospitalized for abd pain at age 61yrs, no diagnosis at this time   ADHD    Anxiety state 05/19/2014   Chronic neck pain    Diverticulitis    Hypertension, benign    Kidney stone    Kidney stone    right side CT 05/16/15 showed 3mm right side kidney stone   Pollen allergies    Vitamin D deficiency     MEDS:   Current Outpatient Medications on File Prior to Visit  Medication Sig Dispense Refill   DIFFERIN 0.1 % gel Apply 1 application topically at bedtime.   1   HYDROcodone-acetaminophen (NORCO) 5-325 MG tablet Take 1-2 tablets by mouth every 6 (six) hours as needed. 15 tablet 0   hydrOXYzine (ATARAX/VISTARIL) 25 MG tablet Take 1 tablet (25 mg total) by mouth every 6 (six) hours. 12 tablet 0   medroxyPROGESTERone (DEPO-PROVERA) 150 MG/ML injection Inject 150 mg into the muscle every 3 (three) months.     metoprolol tartrate (LOPRESSOR) 50 MG tablet Take 1 tablet (50 mg total) by mouth daily. 30 tablet 0   naproxen (NAPROSYN) 375 MG tablet Take 1 tablet (375 mg total) by mouth 2 (two) times daily with a meal. 20 tablet 0   tiZANidine (ZANAFLEX) 4 MG tablet Take 1-2 tabs at bedtime for muscle spasms. 60 tablet 3   No current facility-administered medications on file prior to visit.    ALLERGIES: Amoxicillin   PHYSICAL EXAMINATION:    BP 109/67 (BP Location: Left Arm, Patient Position: Sitting, Cuff Size: Normal)   Pulse (!) 50   Ht 5' (1.524 m)   Wt 122 lb 9.6 oz (55.6 kg)   LMP 12/30/2022 (Approximate)   BMI 23.94 kg/m     General appearance: alert, cooperative and appears  stated age Breasts: normal appearance, no masses or tenderness Abdomen: soft, non-tender; bowel sounds normal; no masses,  no organomegaly Lymph:  no inguinal LAD noted  Pelvic: External genitalia:  no lesions              Urethra:  normal appearing urethra with no masses, tenderness or lesions              Bartholins and Skenes: normal                 Vagina: normal mucosa without prolapse or lesions              Cervix: no bleeding following Pap, no cervical motion tenderness, no lesions, and nulliparous appearance              Bimanual Exam:  Uterus:  normal size, contour, position, consistency, mobility, non-tender              Adnexa: no mass, fullness, tenderness              Anus:  normal sphincter tone, no lesions  Chaperone, Hendricks Milo, CMA, was present for exam.  Assessment/Plan: 1. Cervical cancer screening - Cervicovaginal ancillary only( [place]) - Cytology - PAP( [place])  2. Routine  screening for STI (sexually transmitted infection) Cervicovaginal ancillary only( [place]) - Cytology - PAP( [place])  3. Vaginal Odor - Probiotics encouraged - May consider vaginal boric acid nightly x 30 days - STD screen completed.  RTO one year for annual gyn exam and prn if issues arise. Letta Kocher

## 2023-01-29 ENCOUNTER — Other Ambulatory Visit (HOSPITAL_BASED_OUTPATIENT_CLINIC_OR_DEPARTMENT_OTHER): Payer: Self-pay | Admitting: *Deleted

## 2023-01-29 LAB — CERVICOVAGINAL ANCILLARY ONLY
Bacterial Vaginitis (gardnerella): POSITIVE — AB
Candida Glabrata: NEGATIVE
Candida Vaginitis: NEGATIVE
Chlamydia: NEGATIVE
Comment: NEGATIVE
Comment: NEGATIVE
Comment: NEGATIVE
Comment: NEGATIVE
Comment: NEGATIVE
Comment: NORMAL
Neisseria Gonorrhea: NEGATIVE
Trichomonas: NEGATIVE

## 2023-01-29 LAB — CYTOLOGY - PAP
Adequacy: ABSENT
Diagnosis: NEGATIVE

## 2023-01-29 MED ORDER — METRONIDAZOLE 0.75 % VA GEL: 1.0000 | Freq: Every day | VAGINAL | 0 refills | Status: DC

## 2023-01-29 NOTE — Progress Notes (Signed)
Pt called regarding results. DOB verified. Advised that vaginitis testing shows BV and treatment with flagyl 500mg  twice daily for 7 day or metrogel 1 applicator nightly for 5 nights is recommended. Pt prefers to use metrogel. Rx sent to pharmacy.

## 2023-01-30 ENCOUNTER — Other Ambulatory Visit (HOSPITAL_BASED_OUTPATIENT_CLINIC_OR_DEPARTMENT_OTHER): Payer: Self-pay | Admitting: Certified Nurse Midwife

## 2023-01-30 DIAGNOSIS — N760 Acute vaginitis: Secondary | ICD-10-CM | POA: Insufficient documentation

## 2023-01-30 MED ORDER — METRONIDAZOLE 500 MG PO TABS: 500.0000 mg | ORAL_TABLET | Freq: Two times a day (BID) | ORAL | 3 refills | Status: DC

## 2023-01-30 MED ORDER — FLUCONAZOLE 150 MG PO TABS: 150.0000 mg | ORAL_TABLET | ORAL | 2 refills | Status: DC | PRN

## 2023-02-27 ENCOUNTER — Other Ambulatory Visit (HOSPITAL_BASED_OUTPATIENT_CLINIC_OR_DEPARTMENT_OTHER): Payer: MEDICAID

## 2023-03-09 ENCOUNTER — Encounter (HOSPITAL_BASED_OUTPATIENT_CLINIC_OR_DEPARTMENT_OTHER): Payer: Self-pay | Admitting: Certified Nurse Midwife

## 2023-03-09 ENCOUNTER — Ambulatory Visit (INDEPENDENT_AMBULATORY_CARE_PROVIDER_SITE_OTHER): Payer: MEDICAID | Admitting: Certified Nurse Midwife

## 2023-03-09 VITALS — BP 122/91 | HR 92 | Ht 60.0 in | Wt 122.6 lb

## 2023-03-09 DIAGNOSIS — O26851 Spotting complicating pregnancy, first trimester: Secondary | ICD-10-CM | POA: Insufficient documentation

## 2023-03-09 DIAGNOSIS — N926 Irregular menstruation, unspecified: Secondary | ICD-10-CM

## 2023-03-09 DIAGNOSIS — Z3201 Encounter for pregnancy test, result positive: Secondary | ICD-10-CM | POA: Diagnosis not present

## 2023-03-09 LAB — POCT URINE PREGNANCY: Preg Test, Ur: POSITIVE — AB

## 2023-03-09 MED ORDER — PRENATAL PLUS VITAMIN/MINERAL 27-1 MG PO TABS: 1.0000 | ORAL_TABLET | Freq: Every day | ORAL | 12 refills | Status: DC

## 2023-03-09 NOTE — Progress Notes (Signed)
[name] Escobar is a 22yo G2P0020 at [redacted]w[redacted]d by LMP of 01/30/23 here for 1st trimester spotting. Pt had some bright red spotting this morning which seems to have resolved. She estimates that she conceived around November 28th. She took Plan B. Recently did a home urine pregnancy test which was positive. She has a good support system ([name] Escobar, [name] Escobar).    Assessment: G2P0010 at 5w3 1st trimester spotting  Plan: Blood type pending, Rhogam if indicated Discussed serial quant hcg levels Ectopic precautions reviewed with patient. She will report to ED with pelvic pain, heavy bleeding, fever, chills or feeling dizzy/light-headed. Pt verbalized understanding. Rx sent prenatal vitamins. Repeat Quant HCG 03/12/23.  Letta Kocher

## 2023-03-10 LAB — ABO AND RH: Rh Factor: POSITIVE

## 2023-03-10 LAB — BETA HCG QUANT (REF LAB): hCG Quant: 14274 m[IU]/mL

## 2023-03-12 ENCOUNTER — Encounter (HOSPITAL_BASED_OUTPATIENT_CLINIC_OR_DEPARTMENT_OTHER): Payer: Self-pay | Admitting: Certified Nurse Midwife

## 2023-03-12 ENCOUNTER — Other Ambulatory Visit (HOSPITAL_BASED_OUTPATIENT_CLINIC_OR_DEPARTMENT_OTHER): Payer: MEDICAID

## 2023-03-13 LAB — ABO AND RH: Rh Factor: POSITIVE

## 2023-03-16 ENCOUNTER — Other Ambulatory Visit (HOSPITAL_BASED_OUTPATIENT_CLINIC_OR_DEPARTMENT_OTHER): Payer: MEDICAID

## 2023-03-17 ENCOUNTER — Other Ambulatory Visit (HOSPITAL_BASED_OUTPATIENT_CLINIC_OR_DEPARTMENT_OTHER): Payer: Self-pay | Admitting: *Deleted

## 2023-03-17 DIAGNOSIS — Z3201 Encounter for pregnancy test, result positive: Secondary | ICD-10-CM

## 2023-03-18 LAB — BETA HCG QUANT (REF LAB): hCG Quant: 39820 m[IU]/mL

## 2023-03-25 ENCOUNTER — Other Ambulatory Visit (HOSPITAL_BASED_OUTPATIENT_CLINIC_OR_DEPARTMENT_OTHER): Payer: Self-pay | Admitting: Certified Nurse Midwife

## 2023-03-25 ENCOUNTER — Ambulatory Visit (HOSPITAL_BASED_OUTPATIENT_CLINIC_OR_DEPARTMENT_OTHER): Payer: MEDICAID

## 2023-03-25 DIAGNOSIS — O3680X1 Pregnancy with inconclusive fetal viability, fetus 1: Secondary | ICD-10-CM | POA: Diagnosis not present

## 2023-03-25 DIAGNOSIS — Z3A01 Less than 8 weeks gestation of pregnancy: Secondary | ICD-10-CM

## 2023-03-25 DIAGNOSIS — O3680X0 Pregnancy with inconclusive fetal viability, not applicable or unspecified: Secondary | ICD-10-CM

## 2023-04-01 ENCOUNTER — Telehealth (HOSPITAL_BASED_OUTPATIENT_CLINIC_OR_DEPARTMENT_OTHER): Payer: Self-pay | Admitting: *Deleted

## 2023-04-01 NOTE — Telephone Encounter (Signed)
-----   Message from Yolanda Hence sent at 03/25/2023  4:46 PM EST ----- Regarding: New Ob Kim, She is 7w0 by US  today (unsure LMP). Will you call her at your convenience and schedule Nurse Ob Nurse visit and Initial Prenatal Visit.  Thank you.

## 2023-04-01 NOTE — Telephone Encounter (Signed)
 LMOVM for pt to call office to set up appt for new OB.

## 2023-04-14 ENCOUNTER — Telehealth (HOSPITAL_BASED_OUTPATIENT_CLINIC_OR_DEPARTMENT_OTHER): Payer: Self-pay | Admitting: Certified Nurse Midwife

## 2023-04-14 ENCOUNTER — Encounter (HOSPITAL_BASED_OUTPATIENT_CLINIC_OR_DEPARTMENT_OTHER): Payer: Self-pay | Admitting: Certified Nurse Midwife

## 2023-04-14 NOTE — Telephone Encounter (Signed)
Tried to call patient to follow-up and see where she plans to receive prenatal care. I was not able to leave a message.  [name] Escobar

## 2023-06-05 ENCOUNTER — Ambulatory Visit (HOSPITAL_BASED_OUTPATIENT_CLINIC_OR_DEPARTMENT_OTHER): Payer: MEDICAID | Admitting: Certified Nurse Midwife

## 2023-06-05 ENCOUNTER — Other Ambulatory Visit (HOSPITAL_COMMUNITY)
Admission: RE | Admit: 2023-06-05 | Discharge: 2023-06-05 | Disposition: A | Discharge status: Home / Self Care | Payer: MEDICAID | Source: Ambulatory Visit | Attending: Certified Nurse Midwife | Admitting: Certified Nurse Midwife

## 2023-06-05 ENCOUNTER — Encounter (HOSPITAL_BASED_OUTPATIENT_CLINIC_OR_DEPARTMENT_OTHER): Payer: Self-pay | Admitting: Certified Nurse Midwife

## 2023-06-05 VITALS — BP 109/85 | HR 60 | Ht 60.0 in | Wt 122.0 lb

## 2023-06-05 DIAGNOSIS — N72 Inflammatory disease of cervix uteri: Secondary | ICD-10-CM | POA: Diagnosis not present

## 2023-06-05 DIAGNOSIS — Z113 Encounter for screening for infections with a predominantly sexual mode of transmission: Secondary | ICD-10-CM | POA: Insufficient documentation

## 2023-06-05 DIAGNOSIS — Z3041 Encounter for surveillance of contraceptive pills: Secondary | ICD-10-CM | POA: Diagnosis not present

## 2023-06-05 MED ORDER — DOXYCYCLINE HYCLATE 100 MG PO CAPS: 100.0000 mg | ORAL_CAPSULE | Freq: Two times a day (BID) | ORAL | 0 refills | Status: DC

## 2023-06-05 MED ORDER — DROSPIRENONE-ETHINYL ESTRADIOL 3-0.02 MG PO TABS: 1.0000 | ORAL_TABLET | Freq: Every day | ORAL | 12 refills | Status: AC

## 2023-06-05 MED ORDER — FLUCONAZOLE 150 MG PO TABS: 150.0000 mg | ORAL_TABLET | ORAL | 2 refills | Status: DC | PRN

## 2023-06-05 NOTE — Progress Notes (Signed)
   Subjective:     [name] Escobar is a 23 y.o. female who presents for problem visit. Pt states she had abortion which included suction D&E. She became sexually active with a new partner shortly thereafter. She complains of some pelvic pain with intercourse. States she has had one "normal" period since the ab and desires to start combined oral contraceptives. Her LMP was 3 weeks ago. She will start COC with first day of next menstrual cycle. Pt desires routine STI screening for GC/CT.  The following portions of the patient's history were reviewed and updated as appropriate: allergies, current medications, past family history, past medical history, past social history, past surgical history, and problem list.   Review of Systems Pertinent items are noted in HPI.    Objective:    BP 109/85 (BP Location: Left Arm, Patient Position: Sitting, Cuff Size: Normal)   Pulse 60   Ht 5' (1.524 m)   Wt 122 lb (55.3 kg)   BMI 23.83 kg/m  General appearance: alert, cooperative, and appears stated age Abdomen: soft, non-tender; bowel sounds normal; no masses,  no organomegaly Pelvic: cervix normal in appearance, external genitalia normal, no adnexal masses or tenderness, urethra without abnormality or discharge, uterus normal size, shape, and consistency, vagina normal without discharge, and +CMT noted Neurologic: Alert and oriented X 3, normal strength and tone. Normal symmetric reflexes. Normal coordination and gait    Assessment:    Cervicitis .   Encounter for STD Screening Encounter for surveillance COC  Plan:    Cervicovaginal swab collected for GC/CT/TV/BV/Yeast Doxycycline 100mg  po BID x 7 days   Pt will start Yaz/COC with 1st day of next menstrual period She will call Monday if symptoms have not resolved with antibiotic therapy.  Letta Kocher

## 2023-06-08 ENCOUNTER — Telehealth (HOSPITAL_BASED_OUTPATIENT_CLINIC_OR_DEPARTMENT_OTHER): Payer: Self-pay | Admitting: Certified Nurse Midwife

## 2023-06-08 ENCOUNTER — Encounter (HOSPITAL_BASED_OUTPATIENT_CLINIC_OR_DEPARTMENT_OTHER): Payer: Self-pay | Admitting: Certified Nurse Midwife

## 2023-06-08 LAB — CERVICOVAGINAL ANCILLARY ONLY
Bacterial Vaginitis (gardnerella): NEGATIVE
Candida Glabrata: NEGATIVE
Candida Vaginitis: POSITIVE — AB
Chlamydia: NEGATIVE
Comment: NEGATIVE
Comment: NEGATIVE
Comment: NEGATIVE
Comment: NEGATIVE
Comment: NEGATIVE
Comment: NORMAL
Neisseria Gonorrhea: NEGATIVE
Trichomonas: NEGATIVE

## 2023-06-08 NOTE — Telephone Encounter (Signed)
 [name] Escobar

## 2023-07-13 ENCOUNTER — Telehealth (INDEPENDENT_AMBULATORY_CARE_PROVIDER_SITE_OTHER): Payer: Self-pay | Admitting: Primary Care

## 2023-07-13 NOTE — Telephone Encounter (Signed)
 Called pt to confirm appt. Pt did not answer and LVM

## 2023-07-16 ENCOUNTER — Ambulatory Visit (INDEPENDENT_AMBULATORY_CARE_PROVIDER_SITE_OTHER): Payer: MEDICAID | Admitting: Primary Care

## 2023-07-21 ENCOUNTER — Ambulatory Visit (INDEPENDENT_AMBULATORY_CARE_PROVIDER_SITE_OTHER): Payer: MEDICAID | Admitting: Primary Care

## 2023-12-01 ENCOUNTER — Encounter (HOSPITAL_BASED_OUTPATIENT_CLINIC_OR_DEPARTMENT_OTHER): Payer: Self-pay | Admitting: Certified Nurse Midwife

## 2023-12-01 ENCOUNTER — Ambulatory Visit (INDEPENDENT_AMBULATORY_CARE_PROVIDER_SITE_OTHER): Payer: MEDICAID | Admitting: Certified Nurse Midwife

## 2023-12-01 VITALS — BP 116/81 | HR 69 | Ht 61.0 in | Wt 134.0 lb

## 2023-12-01 DIAGNOSIS — Z3009 Encounter for other general counseling and advice on contraception: Secondary | ICD-10-CM | POA: Diagnosis not present

## 2023-12-01 DIAGNOSIS — R635 Abnormal weight gain: Secondary | ICD-10-CM

## 2023-12-01 MED ORDER — PHEXXI 1.8-1-0.4 % VA GEL: 1.0000 | Freq: Every day | VAGINAL | 12 refills | Status: DC

## 2023-12-01 NOTE — Progress Notes (Signed)
 Subjective:     [name] Escobar is a 23 y.o. female who presents for problem gyn visit. She was started on COC and feels like the COC has contributed to weight gain. Pt thinks she may have possibly gained 15lb. She would like to discuss other contraceptive options that are hormone-free. Pt states she recently had thorough lab testing through [facility] and CBC/TSH found to be normal at that time.    The following portions of the patient's history were reviewed and updated as appropriate: allergies, current medications, past family history, past medical history, past social history, past surgical history, and problem list.   Review of Systems Pertinent items are noted in HPI.    Objective:    BP 116/81   Pulse 69   Ht 5' 1 (1.549 m) Comment: Reported  Wt 134 lb (60.8 kg)   LMP 11/12/2023   BMI 25.32 kg/m  General appearance: alert and cooperative    Assessment:    Encounter for contraceptive counseling.    Plan:    Pt aware of all available methods of contraception available. Prefers hormone-free. She is not interested in Paragard IUD at this time. She will trial Phexxi . Rx sent. Has not concern for STD, no desire for testing.  Arland MARLA Roller

## 2023-12-07 ENCOUNTER — Other Ambulatory Visit (HOSPITAL_BASED_OUTPATIENT_CLINIC_OR_DEPARTMENT_OTHER): Payer: Self-pay | Admitting: Certified Nurse Midwife

## 2023-12-07 MED ORDER — PHEXXI 1.8-1-0.4 % VA GEL: 1.0000 | Freq: Every day | VAGINAL | 12 refills | Status: DC

## 2024-03-15 ENCOUNTER — Ambulatory Visit (HOSPITAL_BASED_OUTPATIENT_CLINIC_OR_DEPARTMENT_OTHER): Payer: MEDICAID | Admitting: Certified Nurse Midwife

## 2024-03-15 ENCOUNTER — Encounter (HOSPITAL_BASED_OUTPATIENT_CLINIC_OR_DEPARTMENT_OTHER): Payer: Self-pay | Admitting: Certified Nurse Midwife

## 2024-03-15 VITALS — BP 116/82 | HR 88 | Ht 61.0 in | Wt 144.4 lb

## 2024-03-15 DIAGNOSIS — R635 Abnormal weight gain: Secondary | ICD-10-CM

## 2024-03-15 DIAGNOSIS — Z6827 Body mass index (BMI) 27.0-27.9, adult: Secondary | ICD-10-CM

## 2024-03-15 DIAGNOSIS — Z3202 Encounter for pregnancy test, result negative: Secondary | ICD-10-CM | POA: Diagnosis not present

## 2024-03-15 DIAGNOSIS — R6882 Decreased libido: Secondary | ICD-10-CM

## 2024-03-15 LAB — POCT URINE PREGNANCY: Preg Test, Ur: NEGATIVE

## 2024-03-15 MED ORDER — METFORMIN HCL ER 500 MG PO TB24: 500.0000 mg | ORAL_TABLET | Freq: Four times a day (QID) | ORAL | 2 refills | Status: AC

## 2024-03-15 MED ORDER — NORETHIN-ETH ESTRAD-FE BIPHAS 1 MG-10 MCG / 10 MCG PO TABS: 1.0000 | ORAL_TABLET | Freq: Every day | ORAL | 11 refills | Status: AC

## 2024-03-15 NOTE — Progress Notes (Signed)
" ° ° °  Subjective:     [name] Escobar is a 23 y.o. female here for problem gyn visit. Pt reports she has complete lack of libido. Pt was evaluated at [facility] in [facility] and told that her testosterone was low. Pt states they offered injections but she does not like needles. Pt interested in trying compounding topical testosterone. Pt would also like to discuss weight gain, difficulty losing weight, excessive weight gain of 20-30lb over past 6 months.  Weight today 144lb. One year ago weight was 122lb (22lb weight gain in 1 year). Pt reports periods are monthly and regular. She would like birth control. Does not desire pregnancy at this time.    The following portions of the patient's history were reviewed and updated as appropriate: allergies, current medications, past family history, past medical history, past social history, past surgical history, and problem list.   Review of Systems Pertinent items are noted in HPI.    Objective:    BP 116/82   Pulse 88   Ht 5' 1 (1.549 m) Comment: reported  Wt 144 lb 6.4 oz (65.5 kg)   LMP 03/01/2024   BMI 27.28 kg/m  General appearance: alert, cooperative, and appears stated age    Assessment:    Decreased Libido.  Contraceptive Counseling Excessive Weight Gain   Plan:    Pt wanted to discuss Depo Provera for contraception. Educated that Depo runs a risk of weight gain. Discussed all options for contraception and pt desires to try a low-dose COC although she is concerned that it may lead to more weight gain. Discussed possibility of PCOS (easy weight gain/difficulty losing weight). Pt will continue w/ adequate water intake/healthy diet/routine exercise. She is interested in a trial of metformin 500mg  po once daily. If Metformin effective at assisting w/ weight loss, pt will slowly increase Metformin to 2,000mg  daily. Rx for Testosterone sent to [facility]. Apply 1ml to thigh once daily. Discussed risks/benefits/dosing w/  patient. Pt aware Testosterone not FDA approved at this time. Plan follow-up in 3-6 months.  Arland POUR Ever Halberg   "
